# Patient Record
Sex: Female | Born: 1935 | Race: White | Hispanic: No
Health system: Southern US, Community
[De-identification: ages and names within clinical notes are randomized; demographics above are authoritative.]

## PROBLEM LIST (undated history)

## (undated) DIAGNOSIS — E079 Disorder of thyroid, unspecified: Secondary | ICD-10-CM

## (undated) DIAGNOSIS — I10 Essential (primary) hypertension: Secondary | ICD-10-CM

## (undated) DIAGNOSIS — M1990 Unspecified osteoarthritis, unspecified site: Secondary | ICD-10-CM

## (undated) DIAGNOSIS — E785 Hyperlipidemia, unspecified: Secondary | ICD-10-CM

## (undated) DIAGNOSIS — F0390 Unspecified dementia without behavioral disturbance: Secondary | ICD-10-CM

## (undated) DIAGNOSIS — E538 Deficiency of other specified B group vitamins: Secondary | ICD-10-CM

## (undated) DIAGNOSIS — R2689 Other abnormalities of gait and mobility: Secondary | ICD-10-CM

## (undated) DIAGNOSIS — L570 Actinic keratosis: Secondary | ICD-10-CM

## (undated) DIAGNOSIS — C439 Malignant melanoma of skin, unspecified: Secondary | ICD-10-CM

## (undated) DIAGNOSIS — J449 Chronic obstructive pulmonary disease, unspecified: Secondary | ICD-10-CM

## (undated) DIAGNOSIS — K219 Gastro-esophageal reflux disease without esophagitis: Secondary | ICD-10-CM

## (undated) DIAGNOSIS — I739 Peripheral vascular disease, unspecified: Secondary | ICD-10-CM

## (undated) DIAGNOSIS — G8929 Other chronic pain: Secondary | ICD-10-CM

## (undated) DIAGNOSIS — M549 Dorsalgia, unspecified: Secondary | ICD-10-CM

## (undated) DIAGNOSIS — D649 Anemia, unspecified: Secondary | ICD-10-CM

## (undated) DIAGNOSIS — F419 Anxiety disorder, unspecified: Secondary | ICD-10-CM

---

## 2008-06-30 ENCOUNTER — Emergency Department (HOSPITAL_COMMUNITY): Admission: EM | Admit: 2008-06-30 | Discharge: 2008-06-30 | Payer: Self-pay | Admitting: Emergency Medicine

## 2009-02-08 ENCOUNTER — Ambulatory Visit: Payer: Self-pay | Admitting: Infectious Disease

## 2009-02-08 ENCOUNTER — Inpatient Hospital Stay (HOSPITAL_COMMUNITY): Admission: EM | Admit: 2009-02-08 | Discharge: 2009-02-09 | Payer: Self-pay | Admitting: Emergency Medicine

## 2010-02-23 IMAGING — CR DG LUMBAR SPINE COMPLETE 4+V
5 series · 5 of 5 positions shown · non-contrast
Comparison: None.

CLINICAL DATA: Low back and left hip and leg pain.

LUMBAR SPINE - COMPLETE 4+ VIEW

[t l-spine a.p.]
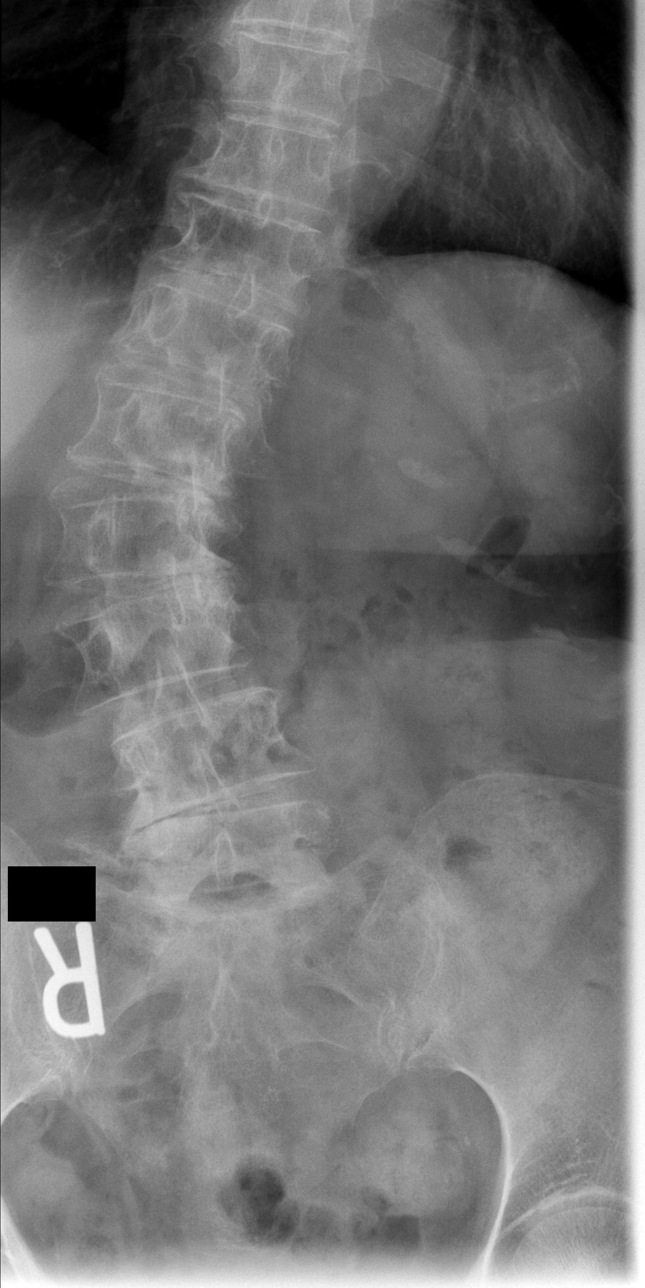

[t l-spine oblique exposure (1 of 2)]
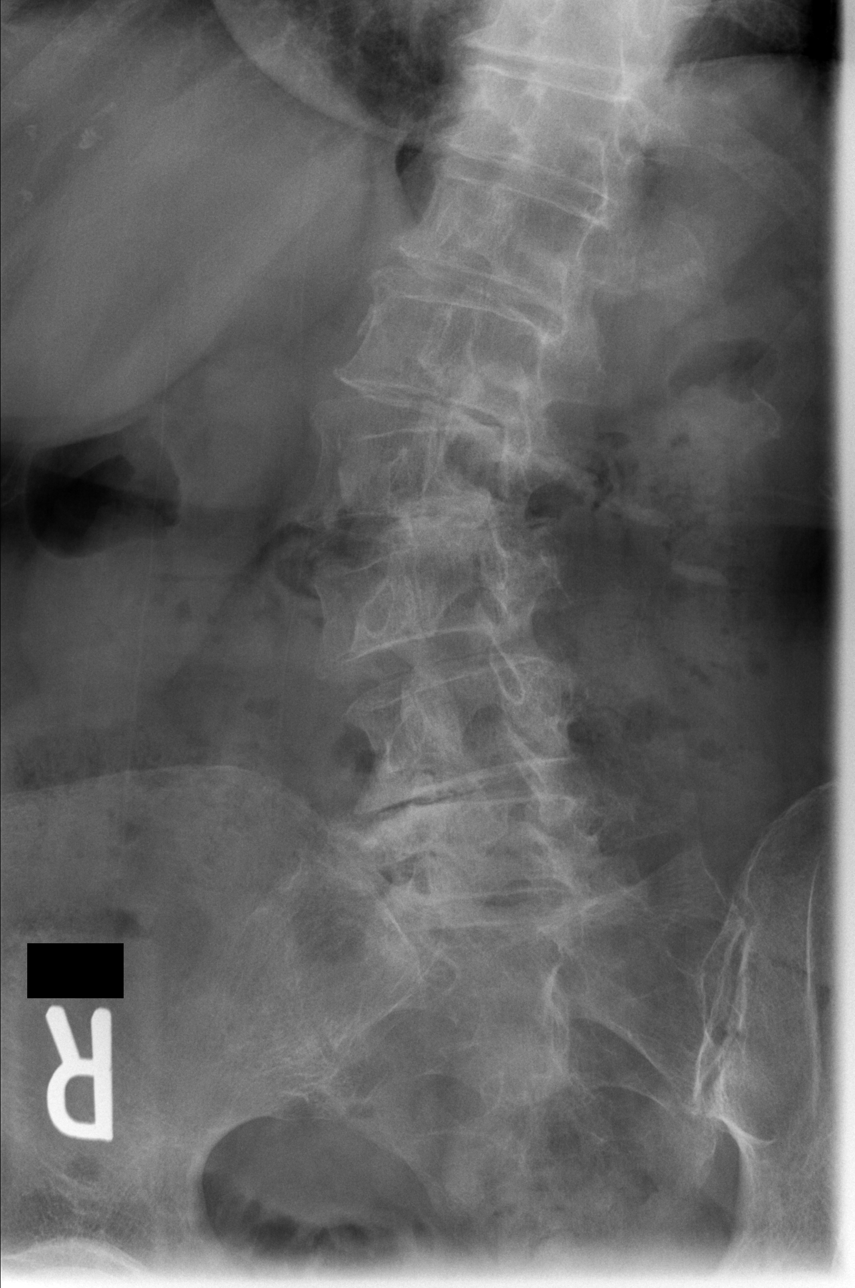

[t l-spine oblique exposure (2 of 2)]
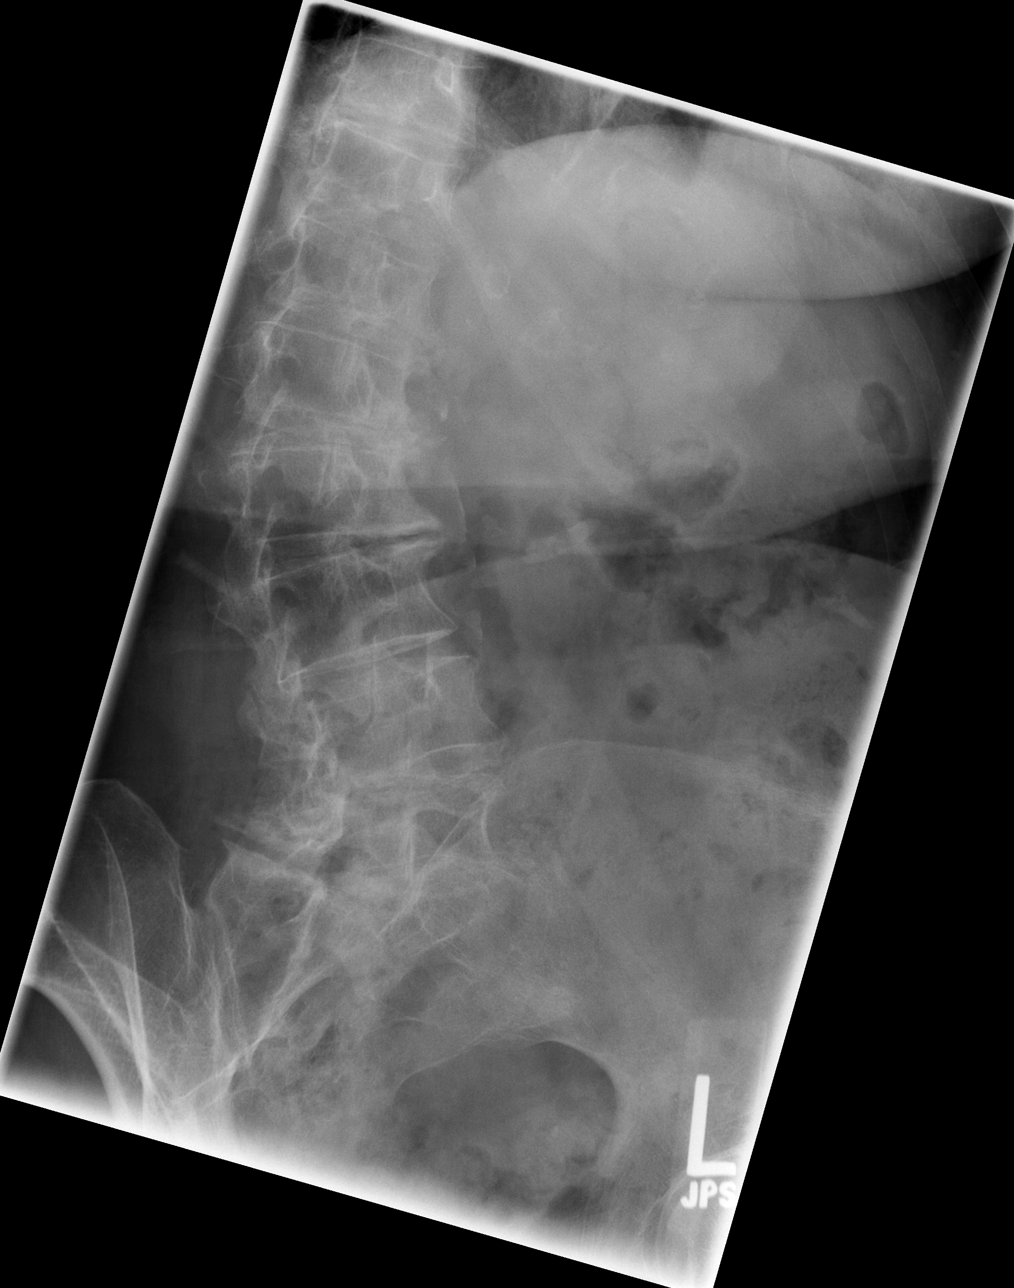

[t l-spine lat]
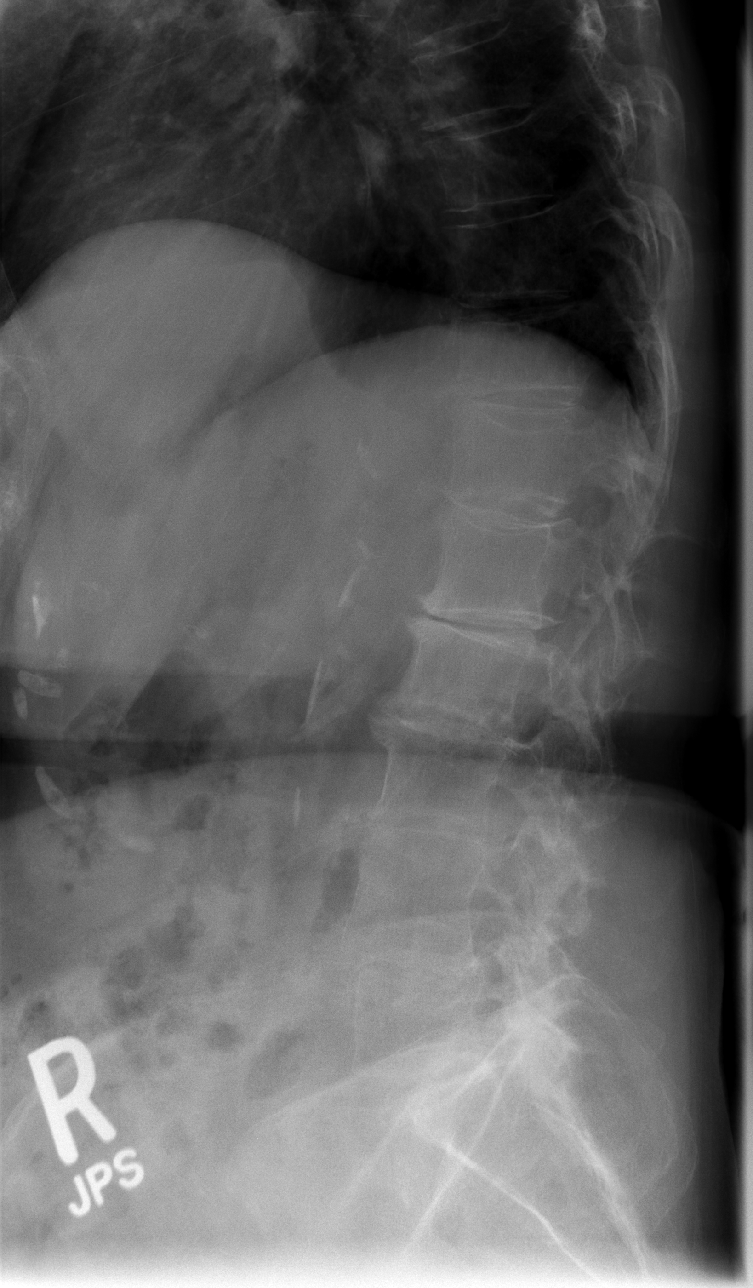

[t l-spine l5-s1 spot]
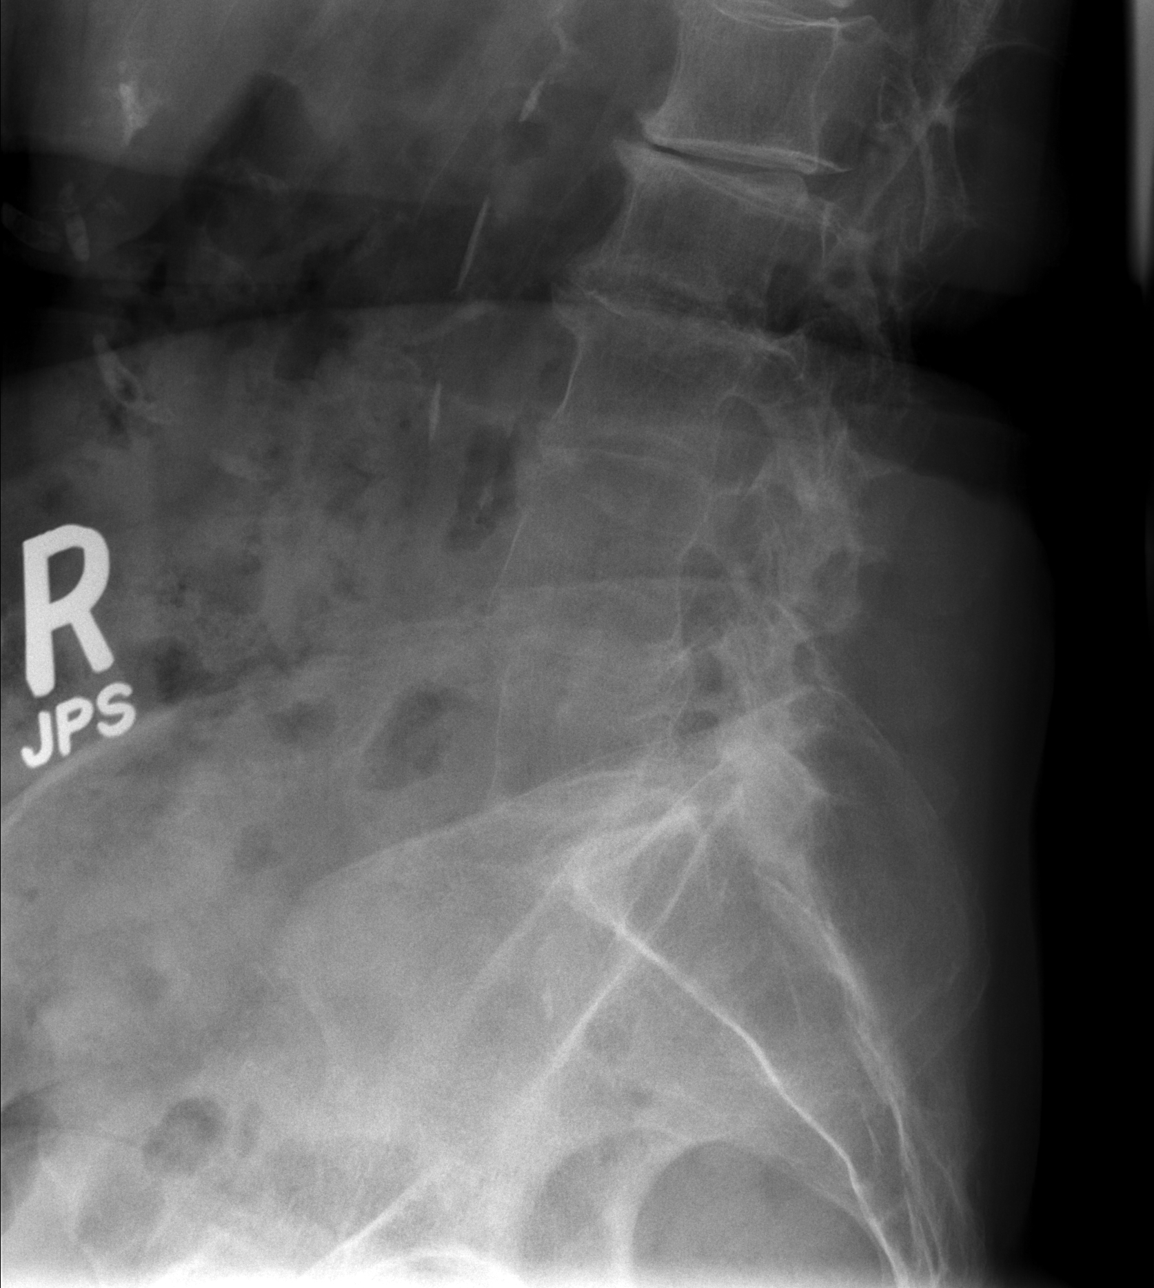

[5 of 5 positions shown; findings below may reference images not displayed]

FINDINGS: Five non-rib bearing lumbar vertebrae.  Moderate
dextroconvex rotary scoliosis.  Straightening of the normal lumbar
lordosis.  Disc space narrowing and spur formation at multiple
levels as well as facet degenerative changes in the lower lumbar
spine.  No fractures, pars defects or subluxations.  Atheromatous
arterial calcifications.
IMPRESSION: Scoliosis and degenerative changes.

## 2010-10-13 LAB — PROTIME-INR
INR: 1 (ref 0.00–1.49)
Prothrombin Time: 13.3 seconds (ref 11.6–15.2)

## 2010-10-13 LAB — CBC
HCT: 29.7 % — ABNORMAL LOW (ref 36.0–46.0)
Hemoglobin: 9.4 g/dL — ABNORMAL LOW (ref 12.0–15.0)
MCHC: 30.2 g/dL (ref 30.0–36.0)
RBC: 3.34 MIL/uL — ABNORMAL LOW (ref 3.87–5.11)
RDW: 19.1 % — ABNORMAL HIGH (ref 11.5–15.5)
WBC: 4.8 10*3/uL (ref 4.0–10.5)
WBC: 5.7 10*3/uL (ref 4.0–10.5)

## 2010-10-13 LAB — CROSSMATCH
ABO/RH(D): O POS
Antibody Screen: NEGATIVE

## 2010-10-13 LAB — BASIC METABOLIC PANEL
GFR calc non Af Amer: >60 mL/min (ref >60)
Glucose, Bld: 87 mg/dL (ref 70–99)
Potassium: 3.8 mEq/L (ref 3.5–5.1)
Sodium: 140 mEq/L (ref 135–145)

## 2010-10-13 LAB — VITAMIN B12: Vitamin B-12: 171 pg/mL — ABNORMAL LOW (ref 211–911)

## 2010-10-13 LAB — IRON AND TIBC
Iron: <10 ug/dL — ABNORMAL LOW (ref 42–135)
UIBC: 537 ug/dL

## 2010-10-13 LAB — COMPREHENSIVE METABOLIC PANEL
ALT: 9 U/L (ref 0–35)
AST: 27 U/L (ref 0–37)
Albumin: 3.7 g/dL (ref 3.5–5.2)
Calcium: 9.7 mg/dL (ref 8.4–10.5)
Creatinine, Ser: 0.65 mg/dL (ref 0.4–1.2)
GFR calc Af Amer: >60 mL/min (ref >60)
GFR calc non Af Amer: >60 mL/min (ref >60)
Sodium: 140 mEq/L (ref 135–145)
Total Protein: 6.6 g/dL (ref 6.0–8.3)

## 2010-10-13 LAB — APTT
aPTT: 26 seconds (ref 24–37)
aPTT: 26 seconds (ref 24–37)

## 2010-10-13 LAB — HEPATIC FUNCTION PANEL
ALT: 12 U/L (ref 0–35)
AST: 21 U/L (ref 0–37)
Indirect Bilirubin: 0.5 mg/dL (ref 0.3–0.9)

## 2010-10-13 LAB — ABO/RH: ABO/RH(D): O POS

## 2010-10-13 LAB — RETICULOCYTES: RBC.: 3.42 MIL/uL — ABNORMAL LOW (ref 3.87–5.11)

## 2010-11-20 NOTE — Discharge Summary (Signed)
Melanie Blackburn, Melanie Blackburn NO.:  [id number]   MEDICAL RECORD NO.:  [id number]          PATIENT TYPE:  INP   LOCATION:  2005                         FACILITY:  [facility]   PHYSICIAN:  Acey Lav, MD  DATE OF BIRTH:  30-Oct-1912   DATE OF ADMISSION:  02/08/2009  DATE OF DISCHARGE:  02/09/2009                               DISCHARGE SUMMARY   DISCHARGE DIAGNOSES:  1. Anemia.  2. Hypertension.  3. Hyperlipidemia.  4. Anxiety.  5. Gastroesophageal reflux disease.  6. Chronic low back pain.  7. Peripheral vascular disease.  8. Insomnia.   DISCHARGE MEDICATIONS:  1. Toprol XL 25 mg by mouth daily.  2. Prevacid 30 mg by mouth daily.  3. Tricor 146 mg by mouth daily.  4. Pravachol 40 mg by mouth daily.  5. Aspirin 81 mg by mouth daily.  6. Aricept 10 mg by mouth daily.   NEW MEDICATIONS:  1. Ferrous sulfate 325 mg by mouth daily.  2. B12 1000 mcg subcutaneous injection daily x1 week, weekly x1 month      and then monthly thereafter.   DISPOSITION AND FOLLOW UP:  The patient has a follow up appointment at  [facility] on Wednesday February 15, 2009 at 3 p.m.  A CBC is  to be checked at follow up.   PROCEDURES PERFORMED:  The patient received 2 units of packed red blood  cells during her admission.   CONSULTANTS:  None.   HISTORY OF PRESENT ILLNESS:  A 77 year old lady with a past medical  history of iron deficiency anemia presenting with dizziness.  She felt  the dizziness especially when turning her head.  It was not a feeling of  vertigo nor did it occur when she sat up.  It went away after a few  minutes of rest.  She was found to have a hemoglobin of 6 at her  assisted living facility, down from 10 three months prior and thus was  sent to the [facility] emergency department.  On admission, she had  continued low hemoglobin.  Her stool was tested for blood which was  negative.  Otherwise, she denied any sources of bleeding.  No  hematemesis, no  melena, no bright red blood per rectum.  No vaginal  bleeding.  She does report a history of requiring iron supplements and  vitamin shots in the past for anemia.  She reports a balanced diet of  meats, vegetables and grains.  She reports a normal colonoscopy 3-4  years ago.   HOSPITAL COURSE:  1. Anemia.  The patient was found to have a mixed anemia.  She had a      microcytic iron deficiency anemia with an MCV of 68 and serum iron      studies consistent with iron deficiency anemia.  She was also found      to be B12 deficient based on serum B12 testing.  For those two      issues, first she was transfused 2 units of packed red blood cells.      She was also started on ferrous  sulfate supplementation during her      stay.  Lastly, she was started on B12 subcutaneous injections as      specified above for supplementation of B12.  She requested that we      not pursue further workup of a bleeding source via EGD or      colonoscopy because of her age and lack of ability to intervene      even if something was found.  2. Hypertension.  We continued metoprolol 25 mg p.o. daily as her home      dose.  3. Hyperlipidemia.  Continued Tricor and Pravachol at home doses.  4. Anxiety.  Continued Ativan 0.5 mg p.o. q.h.s.  5. Dementia.  Continued Aricept 10 mg p.o. daily.   DISPOSITION:  The patient was asymptomatic on discharge with a  hemoglobin which rose appropriately to 9.4.   DISCHARGE LABS AND VITALS:  On discharge day, her CBC was as follows:  Hemoglobin 9.4, otherwise labs were unremarkable.  Vitals on discharge  were temperature 97.7, blood pressure 150/61, pulse 101, respiratory  rate 18, O2 saturation 94% in room air     ______________________________  [name], MS      Acey Lav, MD  Electronically Signed    AB/MEDQ  D:  02/09/2009  T:  02/09/2009  Job:  [id number]   cc:   [facility]

## 2016-01-06 ENCOUNTER — Emergency Department (HOSPITAL_BASED_OUTPATIENT_CLINIC_OR_DEPARTMENT_OTHER): Payer: Medicare Other

## 2016-01-06 ENCOUNTER — Encounter (HOSPITAL_BASED_OUTPATIENT_CLINIC_OR_DEPARTMENT_OTHER): Payer: Self-pay | Admitting: Emergency Medicine

## 2016-01-06 ENCOUNTER — Emergency Department (HOSPITAL_BASED_OUTPATIENT_CLINIC_OR_DEPARTMENT_OTHER)
Admission: EM | Admit: 2016-01-06 | Discharge: 2016-01-06 | Disposition: A | Discharge status: Home / Self Care | Payer: Medicare Other | Attending: Emergency Medicine | Admitting: Emergency Medicine

## 2016-01-06 DIAGNOSIS — Y9301 Activity, walking, marching and hiking: Secondary | ICD-10-CM | POA: Diagnosis not present

## 2016-01-06 DIAGNOSIS — Y929 Unspecified place or not applicable: Secondary | ICD-10-CM | POA: Insufficient documentation

## 2016-01-06 DIAGNOSIS — S62647A Nondisplaced fracture of proximal phalanx of left little finger, initial encounter for closed fracture: Secondary | ICD-10-CM | POA: Insufficient documentation

## 2016-01-06 DIAGNOSIS — W1839XA Other fall on same level, initial encounter: Secondary | ICD-10-CM | POA: Insufficient documentation

## 2016-01-06 DIAGNOSIS — S61412A Laceration without foreign body of left hand, initial encounter: Secondary | ICD-10-CM

## 2016-01-06 DIAGNOSIS — Y999 Unspecified external cause status: Secondary | ICD-10-CM | POA: Diagnosis not present

## 2016-01-06 DIAGNOSIS — J449 Chronic obstructive pulmonary disease, unspecified: Secondary | ICD-10-CM | POA: Diagnosis not present

## 2016-01-06 DIAGNOSIS — M1990 Unspecified osteoarthritis, unspecified site: Secondary | ICD-10-CM | POA: Diagnosis not present

## 2016-01-06 DIAGNOSIS — Z79899 Other long term (current) drug therapy: Secondary | ICD-10-CM | POA: Diagnosis not present

## 2016-01-06 DIAGNOSIS — E785 Hyperlipidemia, unspecified: Secondary | ICD-10-CM | POA: Diagnosis not present

## 2016-01-06 DIAGNOSIS — S62619A Displaced fracture of proximal phalanx of unspecified finger, initial encounter for closed fracture: Secondary | ICD-10-CM

## 2016-01-06 DIAGNOSIS — I10 Essential (primary) hypertension: Secondary | ICD-10-CM | POA: Insufficient documentation

## 2016-01-06 DIAGNOSIS — S6992XA Unspecified injury of left wrist, hand and finger(s), initial encounter: Secondary | ICD-10-CM | POA: Diagnosis present

## 2016-01-06 HISTORY — DX: Essential (primary) hypertension: I10

## 2016-01-06 HISTORY — DX: Chronic obstructive pulmonary disease, unspecified: J44.9

## 2016-01-06 HISTORY — DX: Other chronic pain: G89.29

## 2016-01-06 HISTORY — DX: Malignant melanoma of skin, unspecified: C43.9

## 2016-01-06 HISTORY — DX: Hyperlipidemia, unspecified: E78.5

## 2016-01-06 HISTORY — DX: Gastro-esophageal reflux disease without esophagitis: K21.9

## 2016-01-06 HISTORY — DX: Actinic keratosis: L57.0

## 2016-01-06 HISTORY — DX: Other abnormalities of gait and mobility: R26.89

## 2016-01-06 HISTORY — DX: Deficiency of other specified B group vitamins: E53.8

## 2016-01-06 HISTORY — DX: Anxiety disorder, unspecified: F41.9

## 2016-01-06 HISTORY — DX: Unspecified dementia, unspecified severity, without behavioral disturbance, psychotic disturbance, mood disturbance, and anxiety: F03.90

## 2016-01-06 HISTORY — DX: Dorsalgia, unspecified: M54.9

## 2016-01-06 HISTORY — DX: Anemia, unspecified: D64.9

## 2016-01-06 HISTORY — DX: Unspecified osteoarthritis, unspecified site: M19.90

## 2016-01-06 HISTORY — DX: Peripheral vascular disease, unspecified: I73.9

## 2016-01-06 HISTORY — DX: Disorder of thyroid, unspecified: E07.9

## 2016-01-06 MED ORDER — TRAMADOL HCL 50 MG PO TABS: 50.0000 mg | ORAL_TABLET | Freq: Three times a day (TID) | ORAL | Status: DC | PRN

## 2016-01-06 MED ORDER — TETANUS-DIPHTH-ACELL PERTUSSIS 5-2.5-18.5 LF-MCG/0.5 IM SUSP
0.5000 mL | Freq: Once | INTRAMUSCULAR | Status: AC
  Administered 2016-01-06: 0.5 mL via INTRAMUSCULAR
  Filled 2016-01-06: qty 0.5

## 2016-01-06 NOTE — ED Notes (Signed)
Patient's left 5th lac was cleaned with skin cleanser and bandaged with steri strips, bacitracin and gauze.  A finger splint was also applied.

## 2016-01-06 NOTE — Discharge Instructions (Signed)
Finger Fracture Fractures of fingers are breaks in the bones of the fingers. There are many types of fractures. There are different ways of treating these fractures. Your health care provider will discuss the best way to treat your fracture. CAUSES Traumatic injury is the main cause of broken fingers. These include:  Injuries while playing sports.  Workplace injuries.  Falls. RISK FACTORS Activities that can increase your risk of finger fractures include:  Sports.  Workplace activities that involve machinery.  A condition called osteoporosis, which can make your bones less dense and cause them to fracture more easily. SIGNS AND SYMPTOMS The main symptoms of a broken finger are pain and swelling within 15 minutes after the injury. Other symptoms include:  Bruising of your finger.  Stiffness of your finger.  Numbness of your finger.  Exposed bones (compound fracture) if the fracture is severe. DIAGNOSIS  The best way to diagnose a broken bone is with X-ray imaging. Additionally, your health care provider will use this X-ray image to evaluate the position of the broken finger bones.  TREATMENT  Finger fractures can be treated with:   Nonreduction--This means the bones are in place. The finger is splinted without changing the positions of the bone pieces. The splint is usually left on for about a week to 10 days. This will depend on your fracture and what your health care provider thinks.  Closed reduction--The bones are put back into position without using surgery. The finger is then splinted.  Open reduction and internal fixation--The fracture site is opened. Then the bone pieces are fixed into place with pins or some type of hardware. This is seldom required. It depends on the severity of the fracture. HOME CARE INSTRUCTIONS   Follow your health care provider's instructions regarding activities, exercises, and physical therapy.  Only take over-the-counter or prescription  medicines for pain, discomfort, or fever as directed by your health care provider. SEEK MEDICAL CARE IF: You have pain or swelling that limits the motion or use of your fingers. SEEK IMMEDIATE MEDICAL CARE IF:  Your finger becomes numb. MAKE SURE YOU:   Understand these instructions.  Will watch your condition.  Will get help right away if you are not doing well or get worse.   This information is not intended to replace advice given to you by your health care provider. Make sure you discuss any questions you have with your health care provider.   Document Released: 10/06/2000 Document Revised: 04/14/2013 Document Reviewed: 02/03/2013 Elsevier Interactive Patient Education 2016 Elsevier [employer]  Skin Tear Care A skin tear is a wound in which the top layer of skin has peeled off. This is a common problem with aging because the skin becomes thinner and more fragile as a person gets older. In addition, some medicines, such as oral corticosteroids, can lead to skin thinning if taken for long periods of time.  A skin tear is often repaired with tape or skin adhesive strips. This keeps the skin that has been peeled off in contact with the healthier skin beneath. Depending on the location of the wound, a bandage (dressing) may be applied over the tape or skin adhesive strips. Sometimes, during the healing process, the skin turns black and dies. Even when this happens, the torn skin acts as a good dressing until the skin underneath gets healthier and repairs itself. HOME CARE INSTRUCTIONS   Change dressings once per day or as directed by your caregiver.  Gently clean the skin tear and the area  around the tear using saline solution or mild soap and water.  Do not rub the injured skin dry. Let the area air dry.  Apply petroleum jelly or an antibiotic cream or ointment to keep the tear moist. This will help the wound heal. Do not allow a scab to form.  If the dressing sticks before the next  dressing change, moisten it with warm soapy water and gently remove it.  Protect the injured skin until it has healed.  Only take over-the-counter or prescription medicines as directed by your caregiver.  Take showers or baths using warm soapy water. Apply a new dressing after the shower or bath.  Keep all follow-up appointments as directed by your caregiver.  SEEK IMMEDIATE MEDICAL CARE IF:   You have redness, swelling, or increasing pain in the skin tear.  You havepus coming from the skin tear.  You have chills.  You have a red streak that goes away from the skin tear.  You have a bad smell coming from the tear or dressing.  You have a fever or persistent symptoms for more than 2-3 days.  You have a fever and your symptoms suddenly get worse. MAKE SURE YOU:  Understand these instructions.  Will watch this condition.  Will get help right away if your child is not doing well or gets worse.   This information is not intended to replace advice given to you by your health care provider. Make sure you discuss any questions you have with your health care provider.   Document Released: 03/19/2001 Document Revised: 03/18/2012 Document Reviewed: 01/06/2012 Elsevier Interactive Patient Education [employer].

## 2016-01-06 NOTE — ED Notes (Signed)
Fall 1 hour pta. Injury to left pinkie finger. Pt is a resident at [facility], brought in by her son. Pt alert.

## 2016-01-06 NOTE — ED Provider Notes (Signed)
CSN: [id number]     Arrival date & time 01/06/16  1636 History  By signing my name below, I, Evelene Croon, attest that this documentation has been prepared under the direction and in the presence of Julianne Rice, MD . Electronically Signed: Evelene Croon, Scribe. 01/06/2016. 5:28 PM.   Chief Complaint  Patient presents with  . Hand Injury   LEVEL 5 CAVEAT DUE TO Dementia   The history is provided by a relative (son). No language interpreter was used.    HPI Comments:  Melanie Blackburn is a 82 y.o. female who presents to the Emergency Department with her son s/p fall today ~1hr PTA with a complaint of a fifth digit of the left hand injury following the incident. Per pt's son pt fell forward when she was walking out of her room. Her fall was unwitnessed but he suspects she may have extended her hand to brace for the fall. Son reports laceration the fifth digit of the left hand. No alleviating factors noted. Pt arrives to the ED from [facility]. Patient does not take blood thinners. She's been in her normal mental status since fall.   Past Medical History  Diagnosis Date  . Dementia   . Anxiety disorder   . Thyroid disease   . Hypertension   . Hyperlipidemia   . Impaired gait and mobility   . Anemia     intrinsic factor deficiency, iron related anemia  . Vitamin B12 deficiency   . PVD (peripheral vascular disease) ([place])   . GERD (gastroesophageal reflux disease)   . Actinic keratosis   . Osteoarthritis   . Chronic back pain     spondylosis  . COPD (chronic obstructive pulmonary disease) ([place])   . Bronchitis, obstructive, chronic ([place])   . Melanoma ([place])    History reviewed. No pertinent past surgical history. No family history on file. Social History  Substance Use Topics  . Smoking status: None  . Smokeless tobacco: None  . Alcohol Use: None   OB History    No data available     Review of Systems  Unable to perform ROS: Dementia   Allergies   Review of patient's allergies indicates no known allergies.  Home Medications   Prior to Admission medications   Medication Sig Start Date End Date Taking? Authorizing Provider  acetaminophen (TYLENOL) 500 MG tablet Take 1,000 mg by mouth every 8 (eight) hours.   Yes Historical Provider, MD  alum & mag hydroxide-simeth (MYLANTA) 200-200-20 MG/5ML suspension Take 15 mLs by mouth every 4 (four) hours as needed for indigestion or heartburn.   Yes Historical Provider, MD  famotidine (PEPCID) 20 MG tablet Take 20 mg by mouth daily.   Yes Historical Provider, MD  guaifenesin (ROBAFEN) 100 MG/5ML syrup Take 200 mg by mouth every 6 (six) hours as needed for cough.   Yes Historical Provider, MD  loperamide (IMODIUM) 2 MG capsule Take by mouth as needed for diarrhea or loose stools.   Yes Historical Provider, MD  LORazepam (ATIVAN) 0.5 MG tablet Take 0.5 mg by mouth 2 (two) times daily. Also may have BID PRN   Yes Historical Provider, MD  Menthol, Topical Analgesic, (BIOFREEZE) 4 % GEL Apply topically 2 (two) times daily.   Yes Historical Provider, MD  ondansetron (ZOFRAN) 4 MG tablet Take 4 mg by mouth every 8 (eight) hours as needed for nausea or vomiting.   Yes Historical Provider, MD  polyvinyl alcohol (LIQUIFILM TEARS) 1.4 % ophthalmic solution Place 1  drop into both eyes 3 (three) times daily.   Yes Historical Provider, MD  risperiDONE (RISPERDAL) 0.25 MG tablet Take 0.25 mg by mouth 2 (two) times daily.   Yes Historical Provider, MD  traMADol (ULTRAM) 50 MG tablet Take 1 tablet (50 mg total) by mouth every 8 (eight) hours as needed. 01/06/16   Julianne Rice, MD   BP 143/80 mmHg  Pulse 87  Temp(Src) 98.8 F (37.1 C) (Oral)  Resp 17  Wt 125 lb (56.7 kg)  SpO2 96% Physical Exam  Constitutional: She appears well-developed and well-nourished. No distress.  HENT:  Head: Normocephalic and atraumatic.  Mouth/Throat: Oropharynx is clear and moist.  Eyes: EOM are normal. Pupils are equal, round,  and reactive to light.  Neck: Normal range of motion. Neck supple.  Cardiovascular: Normal rate and regular rhythm.   Pulmonary/Chest: Effort normal and breath sounds normal.  Abdominal: Soft. Bowel sounds are normal.  Musculoskeletal: Normal range of motion. She exhibits tenderness. She exhibits no edema.  Full range of motion of the left wrist without deformity or swelling. Full range of motion of the left elbow and shoulder without deformity or swelling.  Neurological: She is alert.  Follow simple commands. Moving all extremities without deficit. Sensation is grossly intact. Extremely hard of hearing. Mild confusion.  Skin: Skin is warm and dry. No rash noted. No erythema.  Patient has skin tear laceration to the fifth digit of the left hand just distal to the MCP. There is no active bleeding. No gross contamination. Patient does have contusion to the ulnar surface of the left hand but no obvious deformity. Full range of motion of the digits of the left hand. Distal cap refill is intact.   Psychiatric: She has a normal mood and affect. Her behavior is normal.  Nursing note and vitals reviewed.   ED Course  Procedures   DIAGNOSTIC STUDIES:  Oxygen Saturation is 96% on RA, normal by my interpretation.    COORDINATION OF CARE:  5:20 PM Will update Tetanus and apply steri strips and antibiotic ointment to wound.  Discussed treatment plan with pt and son at bedside.  Labs Review Labs Reviewed - No data to display  Imaging Review Dg Hand Complete Left  01/06/2016  CLINICAL DATA:  Fall today. EXAM: LEFT HAND - COMPLETE 3+ VIEW COMPARISON:  None. FINDINGS: Nondisplaced fracture fifth proximal phalanx. No other fracture identified Moderate degenerative change of the base of the thumb. Chondrocalcinosis in the triangular fibrocartilage with radiocarpal joint space narrowing. IMPRESSION: Nondisplaced fracture fifth proximal phalanx. Electronically Signed   By: Franchot Gallo M.D.   On:  01/06/2016 18:04   I have personally reviewed and evaluated these images and lab results as part of my medical decision-making.   EKG Interpretation None      MDM   Final diagnoses:  Proximal phalanx fracture of finger, closed, initial encounter  Skin tear of hand without complication, left, initial encounter    I personally performed the services described in this documentation, which was scribed in my presence. The recorded information has been reviewed and is accurate.   Unknown last tetanus. Updated in the emergency department. We'll get x-ray of the hand to rule out any bony injury. Wound will be irrigated and Steri-Strips applied. Dressing placed.   Pt has a non-displaced fracture through the shaft of the prox phalanx of the 5th digit. No evidence of open fracture. Splint placed. Will have f/u with hand surgery. Return precautions given   Julianne Rice, MD  01/06/16 1847 

## 2017-08-31 IMAGING — DX DG HAND COMPLETE 3+V*L*
3 series · 3 of 3 positions shown · non-contrast
Comparison: None.

CLINICAL DATA: Fall today.

EXAM:
LEFT HAND - COMPLETE 3+ VIEW

[hand pa]
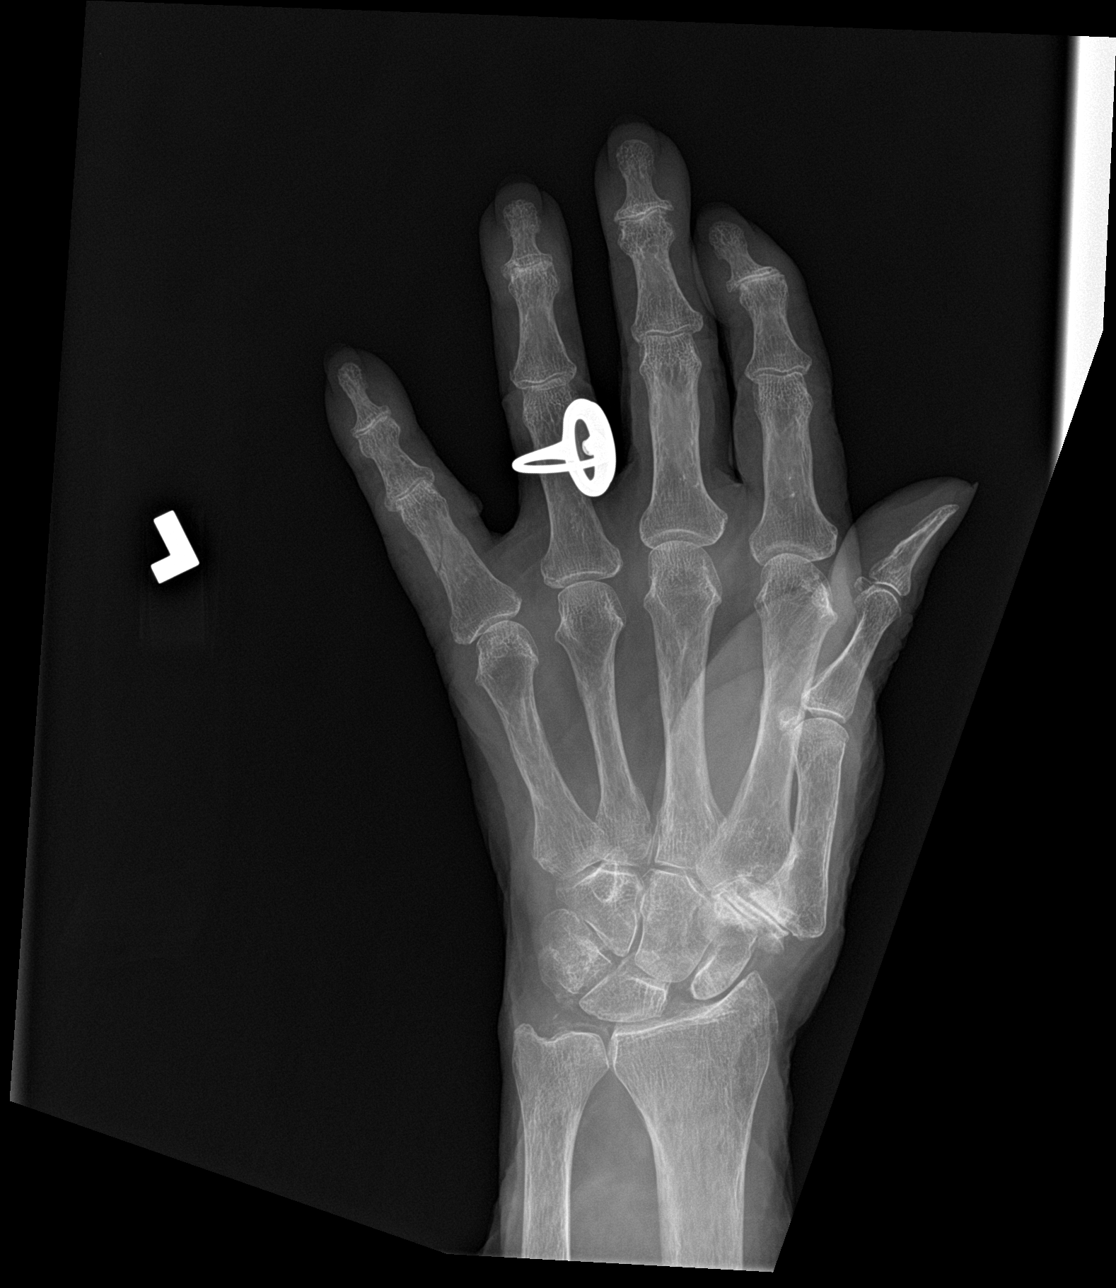

[hand obl]
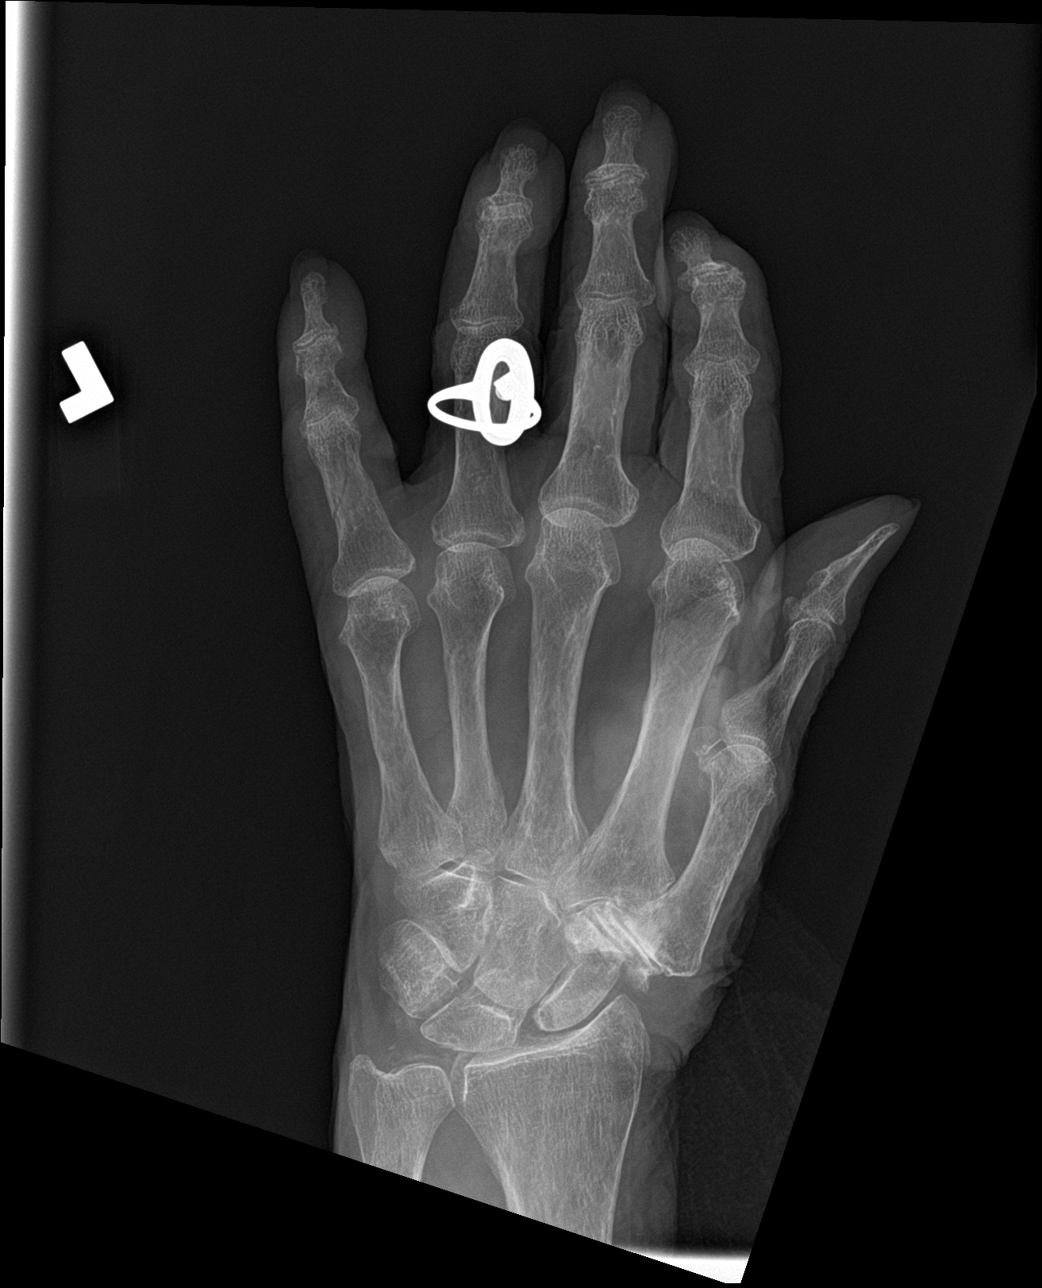

[hand lat]
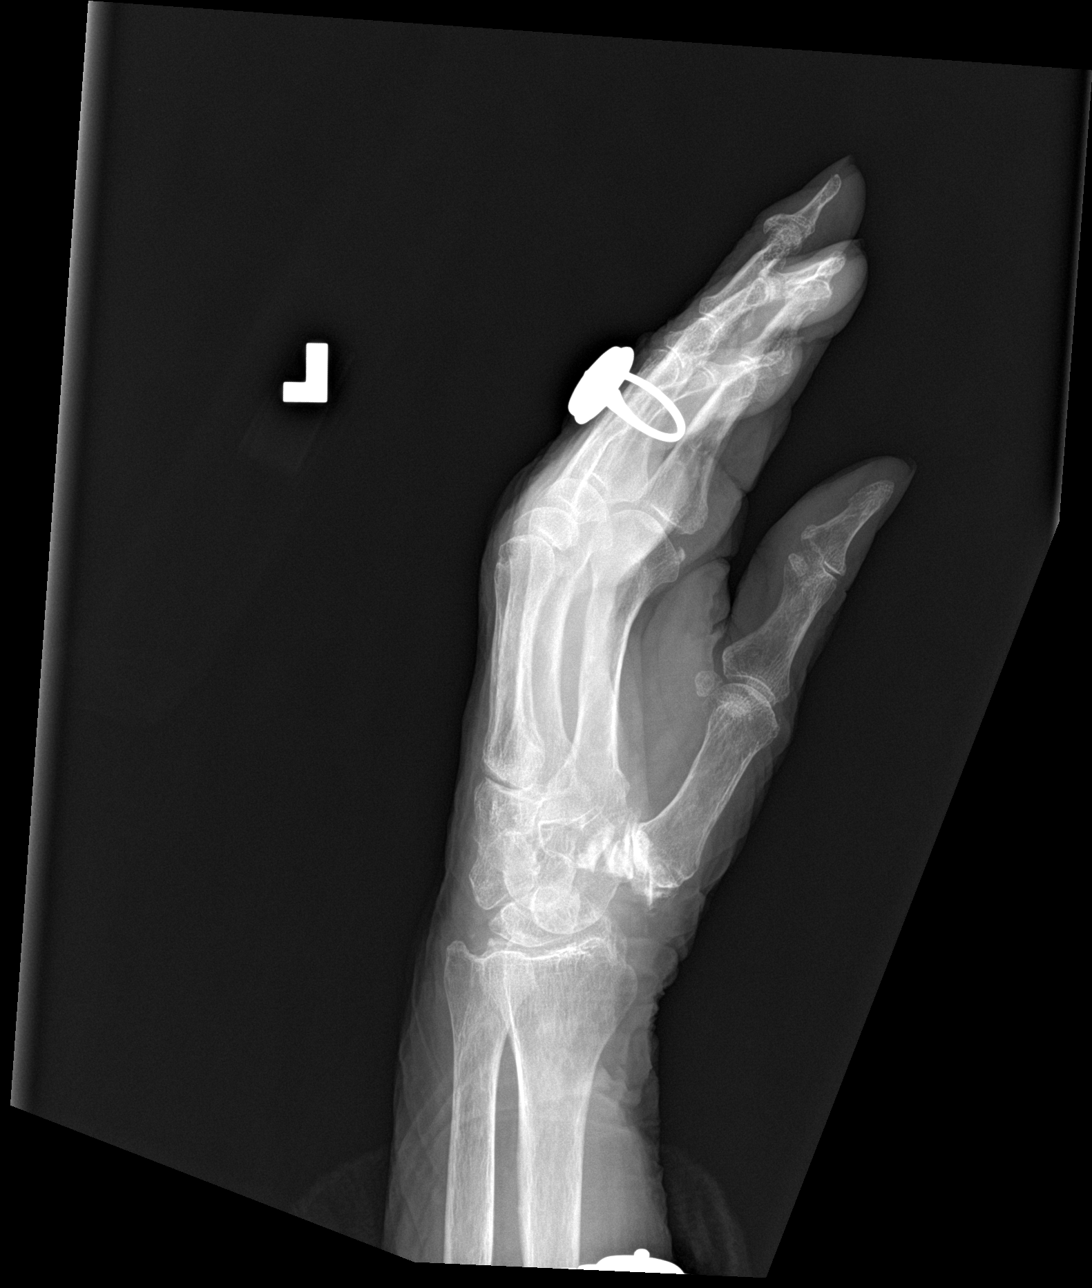

[3 of 3 positions shown; findings below may reference images not displayed]

FINDINGS: Nondisplaced fracture fifth proximal phalanx. No other fracture
identified

Moderate degenerative change of the base of the thumb.
Chondrocalcinosis in the triangular fibrocartilage with radiocarpal
joint space narrowing.
IMPRESSION: Nondisplaced fracture fifth proximal phalanx.

## 2018-07-26 ENCOUNTER — Encounter (HOSPITAL_BASED_OUTPATIENT_CLINIC_OR_DEPARTMENT_OTHER): Payer: Self-pay | Admitting: Emergency Medicine

## 2018-07-26 ENCOUNTER — Other Ambulatory Visit: Payer: Self-pay

## 2018-07-26 ENCOUNTER — Emergency Department (HOSPITAL_BASED_OUTPATIENT_CLINIC_OR_DEPARTMENT_OTHER)
Admission: EM | Admit: 2018-07-26 | Discharge: 2018-07-26 | Disposition: A | Discharge status: Home / Self Care | Payer: Medicare Other | Attending: Emergency Medicine | Admitting: Emergency Medicine

## 2018-07-26 DIAGNOSIS — F0390 Unspecified dementia without behavioral disturbance: Secondary | ICD-10-CM | POA: Insufficient documentation

## 2018-07-26 DIAGNOSIS — Z79899 Other long term (current) drug therapy: Secondary | ICD-10-CM | POA: Diagnosis not present

## 2018-07-26 DIAGNOSIS — Y9389 Activity, other specified: Secondary | ICD-10-CM | POA: Insufficient documentation

## 2018-07-26 DIAGNOSIS — I10 Essential (primary) hypertension: Secondary | ICD-10-CM | POA: Diagnosis not present

## 2018-07-26 DIAGNOSIS — Y92122 Bedroom in nursing home as the place of occurrence of the external cause: Secondary | ICD-10-CM | POA: Insufficient documentation

## 2018-07-26 DIAGNOSIS — S81812A Laceration without foreign body, left lower leg, initial encounter: Secondary | ICD-10-CM | POA: Insufficient documentation

## 2018-07-26 DIAGNOSIS — Y999 Unspecified external cause status: Secondary | ICD-10-CM | POA: Insufficient documentation

## 2018-07-26 DIAGNOSIS — W2209XA Striking against other stationary object, initial encounter: Secondary | ICD-10-CM | POA: Diagnosis not present

## 2018-07-26 DIAGNOSIS — J449 Chronic obstructive pulmonary disease, unspecified: Secondary | ICD-10-CM | POA: Diagnosis not present

## 2018-07-26 MED ORDER — LIDOCAINE-EPINEPHRINE (PF) 2 %-1:200000 IJ SOLN
INTRAMUSCULAR | Status: AC
  Filled 2018-07-26: qty 10

## 2018-07-26 MED ORDER — LIDOCAINE-EPINEPHRINE (PF) 2 %-1:200000 IJ SOLN
20.0000 mL | Freq: Once | INTRAMUSCULAR | Status: DC
  Filled 2018-07-26: qty 20

## 2018-07-26 NOTE — ED Notes (Signed)
Contacted [facility] - patient needs transport to [facility] in [place]

## 2018-07-26 NOTE — ED Notes (Signed)
Spoke with Melanie Blackburn, [facility] assumed care and took paperwork including DNR.

## 2018-07-26 NOTE — Discharge Instructions (Signed)
Please read and follow all provided instructions.  Your diagnoses today include:  1. Laceration of left calf without complication, initial encounter     Tests performed today include:  Vital signs. See below for your results today.   Medications prescribed:   None  Take any prescribed medications only as directed.   Home care instructions:  Follow any educational materials and wound care instructions contained in this packet.   Keep affected area above the level of your heart when possible to minimize swelling. Wash area gently twice a day with warm soapy water. Do not apply alcohol or hydrogen peroxide. Cover the area if it draining or weeping.   Follow-up instructions: Suture Removal: Return to the Emergency Department or see your primary care care doctor in 14 days for a recheck of your wound and removal of your sutures or staples.  Sutures are dissolvable, but may need removed after they weaken.   Return instructions:  Return to the Emergency Department if you have:  Fever  Worsening pain  Worsening swelling of the wound  Pus draining from the wound  Redness of the skin that moves away from the wound, especially if it streaks away from the affected area   Any other emergent concerns  Your vital signs today were: BP 107/82 (BP Location: Left Arm)    Pulse 90    Temp 98.3 F (36.8 C) (Oral)    Resp (!) 24    SpO2 96%  If your blood pressure (BP) was elevated above 135/85 this visit, please have this repeated by your doctor within one month. --------------

## 2018-07-26 NOTE — ED Triage Notes (Signed)
[name] [facility] for laceration. Pt resides at [facility] facility. Pt is able to stand and pivot. Staff was assisting patient from chair to bed and her left calf struck the bed rail creating a laceration. Bleeding controlled. No anticoagulation. Pt is HOH

## 2018-07-26 NOTE — ED Notes (Signed)
Pts son understood dc material. NAD noted. Report given to [employer], from [place] from [place]. Marene Lenz, has paperwork including the patients DNR

## 2018-07-26 NOTE — ED Provider Notes (Signed)
[facility] Provider Note   CSN: [id number] Arrival date & time: 07/26/18  1921     History   Chief Complaint Chief Complaint  Patient presents with  . Laceration    HPI Melanie Blackburn is a 85 y.o. female.  Patient with history of dementia presents from nursing facility.  Patient with standing and pivoting with assistance from staff when her left calf struck a bed rail causing a laceration and skin tear in the calf area.  No anticoagulation.  Bleeding was controlled prior to arrival with pressure.  Level 5 caveat due to dementia.  Patient's son is at bedside who provides additional background history.     Past Medical History:  Diagnosis Date  . Actinic keratosis   . Anemia    intrinsic factor deficiency, iron related anemia  . Anxiety disorder   . Bronchitis, obstructive, chronic ([place])   . Chronic back pain    spondylosis  . COPD (chronic obstructive pulmonary disease) ([place])   . Dementia ([place])   . GERD (gastroesophageal reflux disease)   . Hyperlipidemia   . Hypertension   . Impaired gait and mobility   . Melanoma ([place])   . Osteoarthritis   . PVD (peripheral vascular disease) ([place])   . Thyroid disease   . Vitamin B12 deficiency     There are no active problems to display for this patient.   History reviewed. No pertinent surgical history.   OB History   No obstetric history on file.      Home Medications    Prior to Admission medications   Medication Sig Start Date End Date Taking? Authorizing Provider  acetaminophen (TYLENOL) 325 MG tablet Take 650 mg by mouth every 6 (six) hours as needed.   Yes [provider]  Ferrous Sulfate (SLOW FE) 142 (45 Fe) MG TBCR Take 142 mg by mouth.   Yes [provider]  acetaminophen (TYLENOL) 500 MG tablet Take 1,000 mg by mouth every 8 (eight) hours.    [provider]  alum & mag hydroxide-simeth (MYLANTA) 200-200-20 MG/5ML suspension Take 15 mLs by mouth  every 4 (four) hours as needed for indigestion or heartburn.    [provider]  famotidine (PEPCID) 20 MG tablet Take 20 mg by mouth daily.    [provider]  guaifenesin (ROBAFEN) 100 MG/5ML syrup Take 200 mg by mouth every 6 (six) hours as needed for cough.    [provider]  loperamide (IMODIUM) 2 MG capsule Take by mouth as needed for diarrhea or loose stools.    [provider]  LORazepam (ATIVAN) 0.5 MG tablet Take 0.5 mg by mouth 2 (two) times daily. Also may have BID PRN    [provider]  Menthol, Topical Analgesic, (BIOFREEZE) 4 % GEL Apply topically 2 (two) times daily.    [provider]  ondansetron (ZOFRAN) 4 MG tablet Take 4 mg by mouth every 8 (eight) hours as needed for nausea or vomiting.    [provider]  polyvinyl alcohol (LIQUIFILM TEARS) 1.4 % ophthalmic solution Place 1 drop into both eyes 3 (three) times daily.    [provider]  risperiDONE (RISPERDAL) 0.25 MG tablet Take 0.25 mg by mouth 2 (two) times daily.    [provider]  traMADol (ULTRAM) 50 MG tablet Take 1 tablet (50 mg total) by mouth every 8 (eight) hours as needed. 01/06/16   Julianne Rice, MD    Family History No family history on file.  Social History Social History   Tobacco Use  . Smoking status: Not on file  Substance Use Topics  . Alcohol use: Not on file  . Drug use: Not on file     Allergies   Patient has no known allergies.   Review of Systems Review of Systems  Unable to perform ROS: Dementia     Physical Exam Updated Vital Signs BP 107/82 (BP Location: Left Arm)   Pulse 90   Temp 98.3 F (36.8 C) (Oral)   Resp (!) 24   SpO2 96%   Physical Exam Vitals signs and nursing note reviewed.  Constitutional:      Appearance: She is well-developed.  HENT:     Head: Normocephalic and atraumatic.  Eyes:     Conjunctiva/sclera: Conjunctivae normal.  Neck:     Musculoskeletal: Normal range  of motion and neck supple.  Pulmonary:     Effort: No respiratory distress.  Skin:    General: Skin is warm and dry.     Comments: There is a 5 cm C-shaped laceration to the left lateral calf.  Wound base explored and is clean.  In the mid point of the wound, it extends approximately three quarters of a centimeter in depth.  Exposed subcutaneous fat but no other deeper structures visualized.  No foreign bodies.  Neurological:     Mental Status: She is alert.       ED Treatments / Results  Labs (all labs ordered are listed, but only abnormal results are displayed) Labs Reviewed - No data to display  EKG None  Radiology No results found.  Procedures .Marland KitchenLaceration Repair Date/Time: 07/26/2018 11:27 PM Performed by: Carlisle Cater, PA-C Authorized by: Carlisle Cater, PA-C   Consent:    Consent given by:  Healthcare agent   Risks discussed:  Pain and infection   Alternatives discussed:  No treatment Anesthesia (see MAR for exact dosages):    Anesthesia method:  Local infiltration   Local anesthetic:  Lidocaine 2% WITH epi Laceration details:    Location:  Leg   Leg location:  L lower leg   Length (cm):  5 Repair type:    Repair type:  Simple Pre-procedure details:    Preparation:  Patient was prepped and draped in usual sterile fashion Exploration:    Hemostasis achieved with:  Direct pressure   Wound exploration: wound explored through full range of motion and entire depth of wound probed and visualized     Wound extent: no foreign bodies/material noted and no tendon damage noted     Contaminated: no   Treatment:    Area cleansed with:  Shur-Clens   Amount of cleaning:  Extensive Skin repair:    Repair method:  Sutures   Suture size:  5-0   Wound skin closure material used: Vicryl.   Suture technique:  Simple interrupted   Number of sutures:  5 Approximation:    Approximation:  Close Post-procedure details:    Dressing:  Non-adherent dressing   Patient  tolerance of procedure:  Tolerated well, no immediate complications   (including critical care time)  Medications Ordered in ED Medications  lidocaine-EPINEPHrine (XYLOCAINE W/EPI) 2 %-1:200000 (PF) injection 20 mL (has no administration in time range)  lidocaine-EPINEPHrine (XYLOCAINE W/EPI) 2 %-1:200000 (PF) injection (has no administration in time range)     Initial Impression / Assessment and Plan / ED Course  I have reviewed the triage vital signs and the nursing notes.  Pertinent labs & imaging results that were  available during my care of the patient were reviewed by me and considered in my medical decision making (see chart for details).     Patient seen and examined.  Discussed wound repair and bandaging with patient's son at bedside.  He agrees to proceed.  Vital signs reviewed and are as follows: BP (!) 148/75   Pulse 92   Temp 98.3 F (36.8 C) (Oral)   Resp 20   SpO2 96%   Wound was anesthetized and cleaned as above.  Technically successful wound closure.  I dressed with a sling gauze with overlying gauze held in place with Tegaderm.   Patient counseled on wound care. Patient counseled on need to return or see PCP/urgent care for suture removal in 14 days. Patient was urged to return to the Emergency Department urgently with worsening pain, swelling, expanding erythema especially if it streaks away from the affected area, fever, or if they have any other concerns. Patient verbalized understanding.    Final Clinical Impressions(s) / ED Diagnoses   Final diagnoses:  Laceration of left calf without complication, initial encounter   Patient with minor calf laceration, cleaned and repaired without complication.  Patient discharged back to her nursing facility in good condition.   ED Discharge Orders    None       Carlisle Cater, Hershal Coria 07/26/18 2331    Malvin Johns, MD 07/26/18 [id number]

## 2018-09-06 DEATH — deceased
